# Patient Record
Sex: Male | Born: 1960 | Race: White | Hispanic: No | Marital: Married | Smoking: Never smoker
Health system: Southern US, Community
[De-identification: ages and names within clinical notes are randomized; demographics above are authoritative.]

## PROBLEM LIST (undated history)

## (undated) DIAGNOSIS — M1990 Unspecified osteoarthritis, unspecified site: Secondary | ICD-10-CM

## (undated) DIAGNOSIS — G4730 Sleep apnea, unspecified: Secondary | ICD-10-CM

## (undated) DIAGNOSIS — E039 Hypothyroidism, unspecified: Secondary | ICD-10-CM

## (undated) DIAGNOSIS — R0602 Shortness of breath: Secondary | ICD-10-CM

## (undated) DIAGNOSIS — Z8719 Personal history of other diseases of the digestive system: Secondary | ICD-10-CM

## (undated) DIAGNOSIS — Z87442 Personal history of urinary calculi: Secondary | ICD-10-CM

## (undated) HISTORY — PX: CYSTO: SHX6284

## (undated) HISTORY — PX: FISSURECTOMY: SHX5244

## (undated) HISTORY — PX: PILONIDAL CYST EXCISION: SHX744

---

## 2014-03-05 ENCOUNTER — Other Ambulatory Visit: Payer: Self-pay | Admitting: Neurosurgery

## 2014-03-14 ENCOUNTER — Encounter (HOSPITAL_COMMUNITY): Payer: Self-pay | Admitting: Pharmacy Technician

## 2014-03-16 ENCOUNTER — Encounter (HOSPITAL_COMMUNITY): Payer: Self-pay

## 2014-03-16 ENCOUNTER — Encounter (HOSPITAL_COMMUNITY)
Admission: RE | Admit: 2014-03-16 | Discharge: 2014-03-16 | Disposition: A | Discharge status: Home / Self Care | Payer: BC Managed Care – PPO | Source: Ambulatory Visit | Attending: Neurosurgery | Admitting: Neurosurgery

## 2014-03-16 ENCOUNTER — Encounter (HOSPITAL_COMMUNITY)
Admission: RE | Admit: 2014-03-16 | Discharge: 2014-03-16 | Disposition: A | Discharge status: Home / Self Care | Payer: BC Managed Care – PPO | Source: Ambulatory Visit | Attending: Anesthesiology | Admitting: Anesthesiology

## 2014-03-16 DIAGNOSIS — Z01812 Encounter for preprocedural laboratory examination: Secondary | ICD-10-CM | POA: Insufficient documentation

## 2014-03-16 DIAGNOSIS — Z01810 Encounter for preprocedural cardiovascular examination: Secondary | ICD-10-CM | POA: Insufficient documentation

## 2014-03-16 DIAGNOSIS — Z01818 Encounter for other preprocedural examination: Secondary | ICD-10-CM | POA: Insufficient documentation

## 2014-03-16 HISTORY — DX: Personal history of urinary calculi: Z87.442

## 2014-03-16 HISTORY — DX: Shortness of breath: R06.02

## 2014-03-16 HISTORY — DX: Sleep apnea, unspecified: G47.30

## 2014-03-16 HISTORY — DX: Unspecified osteoarthritis, unspecified site: M19.90

## 2014-03-16 HISTORY — DX: Personal history of other diseases of the digestive system: Z87.19

## 2014-03-16 HISTORY — DX: Hypothyroidism, unspecified: E03.9

## 2014-03-16 LAB — CBC
HEMATOCRIT: 45 % (ref 39.0–52.0)
HEMOGLOBIN: 15.7 g/dL (ref 13.0–17.0)
MCH: 29.5 pg (ref 26.0–34.0)
MCHC: 34.9 g/dL (ref 30.0–36.0)
MCV: 84.6 fL (ref 78.0–100.0)
Platelets: 237 10*3/uL (ref 150–400)
RBC: 5.32 MIL/uL (ref 4.22–5.81)
RDW: 13.2 % (ref 11.5–15.5)
WBC: 10.4 10*3/uL (ref 4.0–10.5)

## 2014-03-16 LAB — BASIC METABOLIC PANEL
BUN: 30 mg/dL — AB (ref 6–23)
CO2: 26 meq/L (ref 19–32)
Calcium: 10.6 mg/dL — ABNORMAL HIGH (ref 8.4–10.5)
Chloride: 97 mEq/L (ref 96–112)
Creatinine, Ser: 1.2 mg/dL (ref 0.50–1.35)
GFR calc Af Amer: 77 mL/min — ABNORMAL LOW (ref >90)
GFR calc non Af Amer: 66 mL/min — ABNORMAL LOW (ref >90)
GLUCOSE: 104 mg/dL — AB (ref 70–99)
POTASSIUM: 4.6 meq/L (ref 3.7–5.3)
Sodium: 138 mEq/L (ref 137–147)

## 2014-03-16 LAB — SURGICAL PCR SCREEN
MRSA, PCR: NEGATIVE
Staphylococcus aureus: NEGATIVE

## 2014-03-16 NOTE — Pre-Procedure Instructions (Addendum)
Stuart Hunter  03/16/2014   Your procedure is scheduled on:  03/23/14  Report to [facility] short stay admitting at 530 AM.  Call this number if you have problems the morning of surgery: [phone]   Remember:   Do not eat food or drink liquids after midnight.   Take these medicines the morning of surgery with A SIP OF WATER: levothyroxine          STOP all herbel meds, nsaids (aleve,naproxen,advil,ibuprofen) 5 days prior to surgery including aspirin, vitamins   Do not wear jewelry, make-up or nail polish.  Do not wear lotions, powders, or perfumes. You may wear deodorant.  Do not shave 48 hours prior to surgery. Men may shave face and neck.  Do not bring valuables to the hospital.  [facility] is not responsible                  for any belongings or valuables.               Contacts, dentures or bridgework may not be worn into surgery.  Leave suitcase in the car. After surgery it may be brought to your room.  For patients admitted to the hospital, discharge time is determined by your                treatment team.               Patients discharged the day of surgery will not be allowed to drive  home.  Name and phone number of your driver:   Special Instructions:  Special Instructions: [place] - Preparing for Surgery  Before surgery, you can play an important role.  Because skin is not sterile, your skin needs to be as free of germs as possible.  You can reduce the number of germs on you skin by washing with CHG (chlorahexidine gluconate) soap before surgery.  CHG is an antiseptic cleaner which kills germs and bonds with the skin to continue killing germs even after washing.  Please DO NOT use if you have an allergy to CHG or antibacterial soaps.  If your skin becomes reddened/irritated stop using the CHG and inform your nurse when you arrive at Short Stay.  Do not shave (including legs and underarms) for at least 48 hours prior to the first CHG shower.  You may shave your  face.  Please follow these instructions carefully:   1.  Shower with CHG Soap the night before surgery and the morning of Surgery.  2.  If you choose to wash your hair, wash your hair first as usual with your normal shampoo.  3.  After you shampoo, rinse your hair and body thoroughly to remove the Shampoo.  4.  Use CHG as you would any other liquid soap.  You can apply chg directly  to the skin and wash gently with scrungie or a clean washcloth.  5.  Apply the CHG Soap to your body ONLY FROM THE NECK DOWN.  Do not use on open wounds or open sores.  Avoid contact with your eyes ears, mouth and genitals (private parts).  Wash genitals (private parts)       with your normal soap.  6.  Wash thoroughly, paying special attention to the area where your surgery will be performed.  7.  Thoroughly rinse your body with warm water from the neck down.  8.  DO NOT shower/wash with your normal soap after using and rinsing off the CHG Soap.  9.  Pat yourself dry with a clean towel.            10.  Wear clean pajamas.            11.  Place clean sheets on your bed the night of your first shower and do not sleep with pets.  Day of Surgery  Do not apply any lotions/deodorants the morning of surgery.  Please wear clean clothes to the hospital/surgery center.   Please read over the following fact sheets that you were given: Pain Booklet, Coughing and Deep Breathing, MRSA Information and Surgical Site Infection Prevention

## 2014-03-16 NOTE — Progress Notes (Signed)
req'd sleep study from [place]

## 2014-03-23 ENCOUNTER — Ambulatory Visit (HOSPITAL_COMMUNITY): Payer: BC Managed Care – PPO

## 2014-03-23 ENCOUNTER — Encounter (HOSPITAL_COMMUNITY)
Admission: RE | Disposition: A | Discharge status: Home / Self Care | Payer: Self-pay | Source: Ambulatory Visit | Attending: Neurosurgery

## 2014-03-23 ENCOUNTER — Encounter (HOSPITAL_COMMUNITY): Payer: Self-pay | Admitting: *Deleted

## 2014-03-23 ENCOUNTER — Ambulatory Visit (HOSPITAL_COMMUNITY)
Admission: RE | Admit: 2014-03-23 | Discharge: 2014-03-24 | Disposition: A | Discharge status: Home / Self Care | Payer: BC Managed Care – PPO | Source: Ambulatory Visit | Attending: Neurosurgery | Admitting: Neurosurgery

## 2014-03-23 ENCOUNTER — Ambulatory Visit (HOSPITAL_COMMUNITY): Payer: BC Managed Care – PPO | Admitting: Anesthesiology

## 2014-03-23 ENCOUNTER — Encounter (HOSPITAL_COMMUNITY): Payer: BC Managed Care – PPO | Admitting: Anesthesiology

## 2014-03-23 DIAGNOSIS — I10 Essential (primary) hypertension: Secondary | ICD-10-CM | POA: Insufficient documentation

## 2014-03-23 DIAGNOSIS — M129 Arthropathy, unspecified: Secondary | ICD-10-CM | POA: Insufficient documentation

## 2014-03-23 DIAGNOSIS — G4730 Sleep apnea, unspecified: Secondary | ICD-10-CM | POA: Insufficient documentation

## 2014-03-23 DIAGNOSIS — M5126 Other intervertebral disc displacement, lumbar region: Secondary | ICD-10-CM | POA: Diagnosis present

## 2014-03-23 DIAGNOSIS — E039 Hypothyroidism, unspecified: Secondary | ICD-10-CM | POA: Insufficient documentation

## 2014-03-23 DIAGNOSIS — K449 Diaphragmatic hernia without obstruction or gangrene: Secondary | ICD-10-CM | POA: Insufficient documentation

## 2014-03-23 HISTORY — PX: LUMBAR LAMINECTOMY/DECOMPRESSION MICRODISCECTOMY: SHX5026

## 2014-03-23 SURGERY — LUMBAR LAMINECTOMY/DECOMPRESSION MICRODISCECTOMY 1 LEVEL
Anesthesia: General | Site: Back | Laterality: Left

## 2014-03-23 MED ORDER — SUCCINYLCHOLINE CHLORIDE 20 MG/ML IJ SOLN
INTRAMUSCULAR | Status: DC | PRN
  Administered 2014-03-23: 160 mg via INTRAVENOUS

## 2014-03-23 MED ORDER — HYDROMORPHONE HCL PF 1 MG/ML IJ SOLN: 0.5000 mg | INTRAMUSCULAR | Status: DC | PRN

## 2014-03-23 MED ORDER — GLYCOPYRROLATE 0.2 MG/ML IJ SOLN
INTRAMUSCULAR | Status: AC
  Filled 2014-03-23: qty 3

## 2014-03-23 MED ORDER — 0.9 % SODIUM CHLORIDE (POUR BTL) OPTIME
TOPICAL | Status: DC | PRN
  Administered 2014-03-23: 1000 mL

## 2014-03-23 MED ORDER — OXYCODONE HCL 5 MG/5ML PO SOLN: 5.0000 mg | Freq: Once | ORAL | Status: DC | PRN

## 2014-03-23 MED ORDER — BUPIVACAINE HCL (PF) 0.25 % IJ SOLN
INTRAMUSCULAR | Status: DC | PRN
  Administered 2014-03-23: 20 mL

## 2014-03-23 MED ORDER — ARTIFICIAL TEARS OP OINT
TOPICAL_OINTMENT | OPHTHALMIC | Status: DC | PRN
  Administered 2014-03-23: 1 via OPHTHALMIC

## 2014-03-23 MED ORDER — CEFAZOLIN SODIUM 1-5 GM-% IV SOLN
1.0000 g | Freq: Three times a day (TID) | INTRAVENOUS | Status: AC
  Administered 2014-03-23 (×2): 1 g via INTRAVENOUS
  Filled 2014-03-23 (×2): qty 50

## 2014-03-23 MED ORDER — ONDANSETRON HCL 4 MG/2ML IJ SOLN: 4.0000 mg | INTRAMUSCULAR | Status: DC | PRN

## 2014-03-23 MED ORDER — FENTANYL CITRATE 0.05 MG/ML IJ SOLN
INTRAMUSCULAR | Status: AC
  Filled 2014-03-23: qty 5

## 2014-03-23 MED ORDER — LIDOCAINE HCL (CARDIAC) 20 MG/ML IV SOLN
INTRAVENOUS | Status: AC
  Filled 2014-03-23: qty 5

## 2014-03-23 MED ORDER — LEVOTHYROXINE SODIUM 75 MCG PO TABS
75.0000 ug | ORAL_TABLET | Freq: Every day | ORAL | Status: DC
  Filled 2014-03-23 (×2): qty 1

## 2014-03-23 MED ORDER — MIDAZOLAM HCL 5 MG/5ML IJ SOLN
INTRAMUSCULAR | Status: DC | PRN
  Administered 2014-03-23: 2 mg via INTRAVENOUS

## 2014-03-23 MED ORDER — HEMOSTATIC AGENTS (NO CHARGE) OPTIME
TOPICAL | Status: DC | PRN
  Administered 2014-03-23: 1 via TOPICAL

## 2014-03-23 MED ORDER — SODIUM CHLORIDE 0.9 % IV SOLN: 250.0000 mL | INTRAVENOUS | Status: DC

## 2014-03-23 MED ORDER — MENTHOL 3 MG MT LOZG
1.0000 | LOZENGE | OROMUCOSAL | Status: DC | PRN
  Administered 2014-03-23: 3 mg via ORAL
  Filled 2014-03-23 (×2): qty 9

## 2014-03-23 MED ORDER — NEOSTIGMINE METHYLSULFATE 1 MG/ML IJ SOLN
INTRAMUSCULAR | Status: AC
  Filled 2014-03-23: qty 10

## 2014-03-23 MED ORDER — GLYCOPYRROLATE 0.2 MG/ML IJ SOLN
INTRAMUSCULAR | Status: DC | PRN
  Administered 2014-03-23: 0.6 mg via INTRAVENOUS

## 2014-03-23 MED ORDER — ACETAMINOPHEN 650 MG RE SUPP: 650.0000 mg | RECTAL | Status: DC | PRN

## 2014-03-23 MED ORDER — OXYCODONE HCL 5 MG PO TABS: 5.0000 mg | ORAL_TABLET | Freq: Once | ORAL | Status: DC | PRN

## 2014-03-23 MED ORDER — PROPOFOL 10 MG/ML IV BOLUS
INTRAVENOUS | Status: AC
  Filled 2014-03-23: qty 20

## 2014-03-23 MED ORDER — HYDROCHLOROTHIAZIDE 25 MG PO TABS
25.0000 mg | ORAL_TABLET | Freq: Every day | ORAL | Status: DC
  Administered 2014-03-23: 25 mg via ORAL
  Filled 2014-03-23 (×2): qty 1

## 2014-03-23 MED ORDER — ACETAMINOPHEN 325 MG PO TABS: 650.0000 mg | ORAL_TABLET | ORAL | Status: DC | PRN

## 2014-03-23 MED ORDER — PROPOFOL 10 MG/ML IV BOLUS
INTRAVENOUS | Status: DC | PRN
  Administered 2014-03-23: 200 mg via INTRAVENOUS

## 2014-03-23 MED ORDER — SUCCINYLCHOLINE CHLORIDE 20 MG/ML IJ SOLN
INTRAMUSCULAR | Status: AC
  Filled 2014-03-23: qty 1

## 2014-03-23 MED ORDER — NEOSTIGMINE METHYLSULFATE 1 MG/ML IJ SOLN
INTRAMUSCULAR | Status: DC | PRN
  Administered 2014-03-23: 4 mg via INTRAVENOUS

## 2014-03-23 MED ORDER — DOCUSATE SODIUM 100 MG PO CAPS
100.0000 mg | ORAL_CAPSULE | Freq: Two times a day (BID) | ORAL | Status: DC
  Administered 2014-03-23 (×2): 100 mg via ORAL
  Filled 2014-03-23 (×3): qty 1

## 2014-03-23 MED ORDER — LISINOPRIL 20 MG PO TABS
20.0000 mg | ORAL_TABLET | Freq: Every day | ORAL | Status: DC
  Administered 2014-03-23: 20 mg via ORAL
  Filled 2014-03-23 (×2): qty 1

## 2014-03-23 MED ORDER — SODIUM CHLORIDE 0.9 % IJ SOLN: 3.0000 mL | INTRAMUSCULAR | Status: DC | PRN

## 2014-03-23 MED ORDER — SODIUM CHLORIDE 0.9 % IR SOLN
Status: DC | PRN
  Administered 2014-03-23: 07:00:00

## 2014-03-23 MED ORDER — ALUM & MAG HYDROXIDE-SIMETH 200-200-20 MG/5ML PO SUSP: 30.0000 mL | Freq: Four times a day (QID) | ORAL | Status: DC | PRN

## 2014-03-23 MED ORDER — ROCURONIUM BROMIDE 50 MG/5ML IV SOLN
INTRAVENOUS | Status: AC
  Filled 2014-03-23: qty 2

## 2014-03-23 MED ORDER — LISINOPRIL-HYDROCHLOROTHIAZIDE 20-25 MG PO TABS: 1.0000 | ORAL_TABLET | Freq: Every day | ORAL | Status: DC

## 2014-03-23 MED ORDER — NEOSTIGMINE METHYLSULFATE 1 MG/ML IJ SOLN
INTRAMUSCULAR | Status: AC
Start: 2014-03-23 — End: 2014-03-23
  Filled 2014-03-23: qty 20

## 2014-03-23 MED ORDER — HYDROMORPHONE HCL PF 1 MG/ML IJ SOLN: 0.2500 mg | INTRAMUSCULAR | Status: DC | PRN

## 2014-03-23 MED ORDER — LACTATED RINGERS IV SOLN
INTRAVENOUS | Status: DC | PRN
  Administered 2014-03-23 (×2): via INTRAVENOUS

## 2014-03-23 MED ORDER — IBUPROFEN 800 MG PO TABS
800.0000 mg | ORAL_TABLET | Freq: Four times a day (QID) | ORAL | Status: DC | PRN
  Filled 2014-03-23: qty 1

## 2014-03-23 MED ORDER — CEFAZOLIN SODIUM-DEXTROSE 2-3 GM-% IV SOLR
INTRAVENOUS | Status: AC
  Administered 2014-03-23: 3 g via INTRAVENOUS
  Filled 2014-03-23: qty 50

## 2014-03-23 MED ORDER — CEFAZOLIN SODIUM 1-5 GM-% IV SOLN
INTRAVENOUS | Status: AC
  Filled 2014-03-23: qty 50

## 2014-03-23 MED ORDER — LIDOCAINE HCL (CARDIAC) 20 MG/ML IV SOLN
INTRAVENOUS | Status: DC | PRN
  Administered 2014-03-23: 80 mg via INTRAVENOUS

## 2014-03-23 MED ORDER — OXYCODONE-ACETAMINOPHEN 5-325 MG PO TABS
1.0000 | ORAL_TABLET | ORAL | Status: DC | PRN
  Administered 2014-03-23: 1 via ORAL
  Administered 2014-03-23 – 2014-03-24 (×2): 2 via ORAL
  Filled 2014-03-23: qty 2
  Filled 2014-03-23: qty 1
  Filled 2014-03-23: qty 2

## 2014-03-23 MED ORDER — ONDANSETRON HCL 4 MG/2ML IJ SOLN
INTRAMUSCULAR | Status: AC
  Filled 2014-03-23: qty 2

## 2014-03-23 MED ORDER — PHENOL 1.4 % MT LIQD: 1.0000 | OROMUCOSAL | Status: DC | PRN

## 2014-03-23 MED ORDER — ONDANSETRON HCL 4 MG/2ML IJ SOLN
INTRAMUSCULAR | Status: DC | PRN
  Administered 2014-03-23: 4 mg via INTRAVENOUS

## 2014-03-23 MED ORDER — LIDOCAINE-EPINEPHRINE 1 %-1:100000 IJ SOLN
INTRAMUSCULAR | Status: DC | PRN
  Administered 2014-03-23: 10 mL

## 2014-03-23 MED ORDER — FENTANYL CITRATE 0.05 MG/ML IJ SOLN
INTRAMUSCULAR | Status: DC | PRN
  Administered 2014-03-23 (×2): 100 ug via INTRAVENOUS

## 2014-03-23 MED ORDER — ONDANSETRON HCL 4 MG/2ML IJ SOLN: 4.0000 mg | Freq: Four times a day (QID) | INTRAMUSCULAR | Status: DC | PRN

## 2014-03-23 MED ORDER — SODIUM CHLORIDE 0.9 % IJ SOLN
3.0000 mL | Freq: Two times a day (BID) | INTRAMUSCULAR | Status: DC
  Administered 2014-03-23: 3 mL via INTRAVENOUS

## 2014-03-23 MED ORDER — ROCURONIUM BROMIDE 100 MG/10ML IV SOLN
INTRAVENOUS | Status: DC | PRN
  Administered 2014-03-23: 20 mg via INTRAVENOUS
  Administered 2014-03-23: 15 mg via INTRAVENOUS
  Administered 2014-03-23: 50 mg via INTRAVENOUS

## 2014-03-23 MED ORDER — DEXAMETHASONE SODIUM PHOSPHATE 10 MG/ML IJ SOLN
INTRAMUSCULAR | Status: DC | PRN
  Administered 2014-03-23: 8 mg via INTRAVENOUS

## 2014-03-23 MED ORDER — CYCLOBENZAPRINE HCL 10 MG PO TABS
10.0000 mg | ORAL_TABLET | Freq: Three times a day (TID) | ORAL | Status: DC | PRN
  Administered 2014-03-23 (×2): 10 mg via ORAL
  Filled 2014-03-23 (×2): qty 1

## 2014-03-23 MED ORDER — MIDAZOLAM HCL 2 MG/2ML IJ SOLN
INTRAMUSCULAR | Status: AC
  Filled 2014-03-23: qty 2

## 2014-03-23 SURGICAL SUPPLY — 63 items
ADH SKN CLS APL DERMABOND .7 (GAUZE/BANDAGES/DRESSINGS) ×1
APL SKNCLS STERI-STRIP NONHPOA (GAUZE/BANDAGES/DRESSINGS) ×2
BAG DECANTER FOR FLEXI CONT (MISCELLANEOUS) ×3 IMPLANT
BENZOIN TINCTURE PRP APPL 2/3 (GAUZE/BANDAGES/DRESSINGS) ×5 IMPLANT
BLADE SURG 11 STRL SS (BLADE) ×3 IMPLANT
BLADE SURG ROTATE 9660 (MISCELLANEOUS) IMPLANT
BRUSH SCRUB EZ PLAIN DRY (MISCELLANEOUS) ×3 IMPLANT
BUR MATCHSTICK NEURO 3.0 LAGG (BURR) ×3 IMPLANT
BUR PRECISION FLUTE 6.0 (BURR) ×3 IMPLANT
CANISTER SUCT 3000ML (MISCELLANEOUS) ×3 IMPLANT
CLOSURE WOUND 1/2 X4 (GAUZE/BANDAGES/DRESSINGS) ×1
CONT SPEC 4OZ CLIKSEAL STRL BL (MISCELLANEOUS) ×3 IMPLANT
DECANTER SPIKE VIAL GLASS SM (MISCELLANEOUS) ×3 IMPLANT
DERMABOND ADVANCED (GAUZE/BANDAGES/DRESSINGS) ×2
DERMABOND ADVANCED .7 DNX12 (GAUZE/BANDAGES/DRESSINGS) ×1 IMPLANT
DRAPE LAPAROTOMY 100X72X124 (DRAPES) ×3 IMPLANT
DRAPE MICROSCOPE LEICA (MISCELLANEOUS) ×4 IMPLANT
DRAPE MICROSCOPE ZEISS OPMI (DRAPES) ×1 IMPLANT
DRAPE POUCH INSTRU U-SHP 10X18 (DRAPES) ×5 IMPLANT
DRAPE PROXIMA HALF (DRAPES) IMPLANT
DRAPE SURG 17X23 STRL (DRAPES) ×3 IMPLANT
DRSG OPSITE 4X5.5 SM (GAUZE/BANDAGES/DRESSINGS) ×3 IMPLANT
DRSG OPSITE POSTOP 4X6 (GAUZE/BANDAGES/DRESSINGS) ×2 IMPLANT
DURAPREP 26ML APPLICATOR (WOUND CARE) ×3 IMPLANT
ELECT BLADE 4.0 EZ CLEAN MEGAD (MISCELLANEOUS) ×3
ELECT REM PT RETURN 9FT ADLT (ELECTROSURGICAL) ×3
ELECTRODE BLDE 4.0 EZ CLN MEGD (MISCELLANEOUS) IMPLANT
ELECTRODE REM PT RTRN 9FT ADLT (ELECTROSURGICAL) ×1 IMPLANT
GAUZE SPONGE 4X4 16PLY XRAY LF (GAUZE/BANDAGES/DRESSINGS) IMPLANT
GLOVE BIO SURGEON STRL SZ8 (GLOVE) ×3 IMPLANT
GLOVE ECLIPSE 7.5 STRL STRAW (GLOVE) ×8 IMPLANT
GLOVE ECLIPSE 8.5 STRL (GLOVE) ×2 IMPLANT
GLOVE EXAM NITRILE LRG STRL (GLOVE) ×2 IMPLANT
GLOVE EXAM NITRILE MD LF STRL (GLOVE) IMPLANT
GLOVE EXAM NITRILE XL STR (GLOVE) IMPLANT
GLOVE EXAM NITRILE XS STR PU (GLOVE) IMPLANT
GLOVE INDICATOR 8.0 STRL GRN (GLOVE) ×2 IMPLANT
GLOVE INDICATOR 8.5 STRL (GLOVE) ×3 IMPLANT
GOWN BRE IMP SLV AUR LG STRL (GOWN DISPOSABLE) ×3 IMPLANT
GOWN BRE IMP SLV AUR XL STRL (GOWN DISPOSABLE) ×2 IMPLANT
GOWN SPEC L3 XXLG W/TWL (GOWN DISPOSABLE) ×2 IMPLANT
GOWN STRL REIN 2XL LVL4 (GOWN DISPOSABLE) IMPLANT
GOWN STRL REUS W/ TWL XL LVL3 (GOWN DISPOSABLE) IMPLANT
GOWN STRL REUS W/TWL XL LVL3 (GOWN DISPOSABLE) ×6
KIT BASIN OR (CUSTOM PROCEDURE TRAY) ×3 IMPLANT
KIT ROOM TURNOVER OR (KITS) ×3 IMPLANT
NDL SPNL 22GX3.5 QUINCKE BK (NEEDLE) ×1 IMPLANT
NEEDLE HYPO 22GX1.5 SAFETY (NEEDLE) ×3 IMPLANT
NEEDLE SPNL 22GX3.5 QUINCKE BK (NEEDLE) ×3 IMPLANT
NS IRRIG 1000ML POUR BTL (IV SOLUTION) ×3 IMPLANT
PACK LAMINECTOMY NEURO (CUSTOM PROCEDURE TRAY) ×3 IMPLANT
RUBBERBAND STERILE (MISCELLANEOUS) ×6 IMPLANT
SPONGE GAUZE 4X4 12PLY (GAUZE/BANDAGES/DRESSINGS) ×3 IMPLANT
SPONGE SURGIFOAM ABS GEL SZ50 (HEMOSTASIS) ×3 IMPLANT
STRIP CLOSURE SKIN 1/2X4 (GAUZE/BANDAGES/DRESSINGS) ×2 IMPLANT
SUT VIC AB 0 CT1 18XCR BRD8 (SUTURE) ×1 IMPLANT
SUT VIC AB 0 CT1 8-18 (SUTURE) ×3
SUT VIC AB 2-0 CT1 18 (SUTURE) ×3 IMPLANT
SUT VICRYL 4-0 PS2 18IN ABS (SUTURE) ×3 IMPLANT
SYR 20ML ECCENTRIC (SYRINGE) ×3 IMPLANT
TOWEL OR 17X24 6PK STRL BLUE (TOWEL DISPOSABLE) ×3 IMPLANT
TOWEL OR 17X26 10 PK STRL BLUE (TOWEL DISPOSABLE) ×3 IMPLANT
WATER STERILE IRR 1000ML POUR (IV SOLUTION) ×3 IMPLANT

## 2014-03-23 NOTE — H&P (Signed)
Stuart Hunter is an 53 y.o. male.   Chief Complaint: Back and bilateral leg pain left greater right HPI: Patient is a very pleasant 53 year old gentleman who's had progressive worsening back and bilateral leg pain worse on the left ring down the back and outside of his calf into his foot his big toe consistent with an L5 nerve root pattern. Workup showed a very large disc herniation L4-5 on the left with extension centrally and slightly rightward causing severe thecal sac compression and bilateral L5 radiculopathies. Patient failed all forms of conservative treatment have progressive worsening pain and due to the size of the disc herniation and severity of the stenosis I recommended laminectomy discectomy. I recommended a left-sided approach with the possibility of having exposed the right side depending on access extensively reviewed the risks and benefits of the operation with him as well as perioperative course and expectations of outcome and alternatives of surgery he understands and agrees to proceed forward.  Past Medical History  Diagnosis Date  . Sleep apnea     dx used cpap awhile "sleep better without"  . Shortness of breath     exersion  . Hypothyroidism   . History of kidney stones   . H/O hiatal hernia   . Arthritis     Past Surgical History  Procedure Laterality Date  . Cysto    . Fissurectomy    . Pilonidal cyst excision      History reviewed. No pertinent family history. Social History:  reports that he has never smoked. He does not have any smokeless tobacco history on file. He reports that he does not drink alcohol or use illicit drugs.  Allergies: No Known Allergies  Medications Prior to Admission  Medication Sig Dispense Refill  . ibuprofen (ADVIL,MOTRIN) 200 MG tablet Take 800 mg by mouth every 6 (six) hours as needed for moderate pain.      Marland Kitchen levothyroxine (SYNTHROID, LEVOTHROID) 75 MCG tablet Take 75 mcg by mouth daily before breakfast.      .  lisinopril-hydrochlorothiazide (PRINZIDE,ZESTORETIC) 20-25 MG per tablet Take 1 tablet by mouth daily.        No results found for this or any previous visit (from the past 48 hour(s)). No results found.  Review of Systems  Constitutional: Negative.   HENT: Negative.   Eyes: Negative.   Respiratory: Negative.   Cardiovascular: Negative.   Gastrointestinal: Negative.   Genitourinary: Negative.   Musculoskeletal: Positive for back pain and joint pain.  Skin: Negative.   Neurological: Positive for tingling and sensory change.  Endo/Heme/Allergies: Negative.     Blood pressure 153/87, pulse 80, temperature 97.6 F (36.4 C), temperature source Oral, resp. rate 20, SpO2 96.00%. Physical Exam  Constitutional: He is oriented to person, place, and time. He appears well-developed and well-nourished.  HENT:  Head: Normocephalic.  Eyes: Pupils are equal, round, and reactive to light.  Neck: Normal range of motion.  Cardiovascular: Normal rate.   Respiratory: Effort normal.  GI: Soft. Bowel sounds are normal.  Neurological: He is alert and oriented to person, place, and time. He has normal strength. GCS eye subscore is 4. GCS verbal subscore is 5. GCS motor subscore is 6.  Reflex Scores:      Tricep reflexes are 2+ on the right side and 2+ on the left side.      Bicep reflexes are 2+ on the right side and 2+ on the left side.      Brachioradialis reflexes are 2+ on the  right side and 2+ on the left side.      Patellar reflexes are 2+ on the right side and 2+ on the left side.      Achilles reflexes are 2+ on the right side and 2+ on the left side. Strength is 5 out of 5 in his iliopsoas, quads, anterior tibialis, gastrocs, tibialis, and EHL.  Skin: Skin is warm and dry.     Assessment/Plan 53 year old gentleman presents for an L4-5 laminectomy microdiscectomy.  Elaina Hoops 03/23/2014, 7:12 AM

## 2014-03-23 NOTE — Progress Notes (Signed)
Brief Nutrition Note   RD drawn to patient for Positive Malnutrition Screening Tool Score   Ht: 5'11" Wt: 403 lbs BMI: 56   Diet order: Regular   53 year old gentleman who's had progressive worsening back and bilateral leg pain. Workup showed a very large disc herniation L4-5 on the left with extension centrally and slightly rightward causing severe thecal sac compression and bilateral L5 radiculopathies.  Procedure 4/17: Lumbar laminectomy discectomy L4-5 left with microdissection of the left L5 nerve root microscopic discectomy  No evidence of recent weight loss per patient weight history in chart and pt's report. Pt is eating 100% of his meals. Suspect MST score was inaccurate.   Labs and Medications Reviewed. Please consult if needed.  Carrolyn Leigh, BS Nutrition Intern  Pager: [phone]

## 2014-03-23 NOTE — Op Note (Signed)
Preoperative diagnosis: Left greater right L5 radiculopathy from large herniated nucleus pulposus L4-5 left  Postoperative diagnosis: Same  Procedure: Lumbar laminectomy discectomy L4-5 left with microdissection of the left L5 nerve root microscopic discectomy  Surgeon: Dominica Severin Sadi Arave  Assistant: Mallie Mussel pool  Anesthesia: Gen.  EBL: Minimal  History of present illness: Patient is a very pleasant 53 year old some is a progress worsening back and predominately left left leg pain rate in the back of his leg back and outside of his Tops indicative consistent with an L5 nerve root pattern workup revealed a very large disc herniation L4-5 left of center and it is predominantly left leg symptoms and imaging findings and failure conservative treatment I recommended a left-sided L4-5 laminectomy and discectomy I extensively went over the risks and benefits of the operation the patient as well as perioperative course expectations about alternatives of surgery and he understood and agreed to proceed forward.  Operative procedure: Patient brought into the or was induced under general anesthesia positioned prone on the [facility] table his back was prepped and draped in routine sterile fashion preoperative x-ray localize the appropriate level so after infiltration of 10 cc lidocaine with epi a midline incision was made and Bovie left car was used to gas in tissues and subperiosteal dissections care lamina of L4 and L5 on the left interoperative x-ray initially identify the L3-4 disc space however subsequent x-ray identified the L4-5 disc space then the lamina inferior lamina of L4 medial facet complexes suppressant of lamina of L5 was drilled down a high-speed drill. Laminotomy was begun with a 3 mm Kerrison punch identify the ligamentum flavum which was removed in piecemeal fashion. Under microscopic illumination the L5 pedicles identified the undersurface the medial gutter was further out of bed to gain access to the  lateral margins of the disc space the disc herniation free fragment was immediately identified this was teased away with I nerve hook and removed with a pituitary rongeurs then several large additional fibers removed superiorly the space and this space was identified was bulging this was incised cleanout pituitary rongeurs I probed all across the midline to the SI felt no resistance after all the fragments at the removed. Again is Kit[place] no further stenosis centrally or foraminally I felt like I got an adequate palpation the undersurface the disc space the annulus was a slack and did not feel any resistance feeling only across towards the right side did not feel like I had to expose the right side. Was in to see her get meticulous in space was maintained Gelfoam was laid up the dura the muscle fascia proximal in layers with after Vicryl and the skin was closed with a running 4 subcuticular benzo and Steri-Strips were applied patient recovered in stable condition. At the case on it counts sponge counts were correct.

## 2014-03-23 NOTE — Progress Notes (Signed)
Pt transferred to 4N07 on bari bed, pt ambulated to the bathroom without assistive device upon admission, tolerated ambulation well, slightly unsteady gait. Pt voided clear yellow urine. C/o "soreness" in the throat, minimal pain in at op site.  Will monitor.

## 2014-03-23 NOTE — Anesthesia Preprocedure Evaluation (Addendum)
Anesthesia Evaluation  Patient identified by MRN, date of birth, ID band Patient awake    Reviewed: Allergy & Precautions, H&P , NPO status , Patient's Chart, lab work & pertinent test results  History of Anesthesia Complications Negative for: history of anesthetic complications  Airway Mallampati: II  Neck ROM: full    Dental   Pulmonary shortness of breath, sleep apnea (doesnt use cpap mask) ,          Cardiovascular hypertension, Pt. on medications negative cardio ROS      Neuro/Psych negative neurological ROS     GI/Hepatic hiatal hernia,   Endo/Other  Hypothyroidism Morbid obesity  Renal/GU      Musculoskeletal  (+) Arthritis -,   Abdominal   Peds  Hematology   Anesthesia Other Findings   Reproductive/Obstetrics                          Anesthesia Physical Anesthesia Plan  ASA: II  Anesthesia Plan: General   Post-op Pain Management:    Induction: Intravenous  Airway Management Planned: Oral ETT  Additional Equipment:   Intra-op Plan:   Post-operative Plan: Extubation in OR  Informed Consent: I have reviewed the patients History and Physical, chart, labs and discussed the procedure including the risks, benefits and alternatives for the proposed anesthesia with the patient or authorized representative who has indicated his/her understanding and acceptance.     Plan Discussed with: CRNA, Anesthesiologist and Surgeon  Anesthesia Plan Comments:         Anesthesia Quick Evaluation

## 2014-03-23 NOTE — Progress Notes (Signed)
Agree with intern note. Inaccurate MST. Consult RD if needed. Pryor Ochoa RD, LDN Inpatient Clinical Dietitian Pager: [phone] After Hours Pager: [phone]

## 2014-03-23 NOTE — Transfer of Care (Signed)
Immediate Anesthesia Transfer of Care Note  Patient: Stuart Hunter  Procedure(s) Performed: Procedure(s): LEFT LUMBAR FOUR-FIVE LAMINECTOMY/DECOMPRESSION MICRODISCECTOMY  (Left)  Patient Location: PACU  Anesthesia Type:General  Level of Consciousness: awake, alert  and oriented  Airway & Oxygen Therapy: Patient Spontanous Breathing and Patient connected to nasal cannula oxygen  Post-op Assessment: Report given to PACU RN and Post -op Vital signs reviewed and stable  Post vital signs: Reviewed and stable  Complications: No apparent anesthesia complications

## 2014-03-23 NOTE — Anesthesia Postprocedure Evaluation (Signed)
Anesthesia Post Note  Patient: Stuart Hunter  Procedure(s) Performed: Procedure(s) (LRB): LEFT LUMBAR FOUR-FIVE LAMINECTOMY/DECOMPRESSION MICRODISCECTOMY  (Left)  Anesthesia type: General  Patient location: PACU  Post pain: Pain level controlled and Adequate analgesia  Post assessment: Post-op Vital signs reviewed, Patient's Cardiovascular Status Stable, Respiratory Function Stable, Patent Airway and Pain level controlled  Last Vitals:  Filed Vitals:   03/23/14 1048  BP:   Pulse: 69  Temp: 36.1 C  Resp: 13    Post vital signs: Reviewed and stable  Level of consciousness: awake, alert  and oriented  Complications: No apparent anesthesia complications

## 2014-03-24 NOTE — Discharge Instructions (Signed)
No lifting no bending no twisting no driving a riding a car less discomfort in back and forth to see me. Keep the incision clean and dry. May remove the outer dressing to 3 days cover the Steri-Strips with Saran wrap for showers only.

## 2014-03-24 NOTE — Discharge Summary (Signed)
  Physician Discharge Summary  Patient ID: Stuart Hunter MRN: [id number] DOB/AGE: 05-12-1961 53 y.o.  Admit date: 03/23/2014 Discharge date: 03/24/2014  Admission Diagnoses: Left L5 radiculopathy from herniated nuclear pulposus L4-5 left  Discharge Diagnoses: Same Active Problems:   HNP (herniated nucleus pulposus), lumbar   Discharged Condition: good  Hospital Course: Patient is admitted hospital underwent an L4-5 laminectomy and discectomy postop patient did very well recovered in the floor on the floor he was angling well voiding spontaneous he tolerating regular diet and stable for discharge was scheduled followup approximately 2 weeks.  Consults: Significant Diagnostic Studies: Treatments: Lumbar laminectomy microscope L4-5 left Discharge Exam: Blood pressure 113/68, pulse 84, temperature 98.6 F (37 C), temperature source Oral, resp. rate 18, height 5\' 11"  (1.803 m), weight 183 kg (403 lb 7.1 oz), SpO2 100.00%. Strength out of 5 wound clean and dry  Disposition: Home     Medication List         ibuprofen 200 MG tablet  Commonly known as:  ADVIL,MOTRIN  Take 800 mg by mouth every 6 (six) hours as needed for moderate pain.     levothyroxine 75 MCG tablet  Commonly known as:  SYNTHROID, LEVOTHROID  Take 75 mcg by mouth daily before breakfast.     lisinopril-hydrochlorothiazide 20-25 MG per tablet  Commonly known as:  PRINZIDE,ZESTORETIC  Take 1 tablet by mouth daily.           Follow-up Information   Follow up with [facility], MD.   Specialty:  Neurosurgery   Contact information:   [address] [place] [address] [phone]       Signed: Elaina Hoops 03/24/2014, 8:17 AM

## 2014-03-24 NOTE — Progress Notes (Signed)
Pt d/c to home by car with family. Assessment stable. Pt verbalizes understanding of d/c instructions. 

## 2014-03-24 NOTE — Progress Notes (Signed)
Patient ID: Stuart Hunter, male   DOB: 05-13-1961, 53 y.o.   MRN: [id number] Doing well no leg pain numbness improving  Strength out of 5 wound clean and dry  Discharge home

## 2014-03-27 ENCOUNTER — Encounter (HOSPITAL_COMMUNITY): Payer: Self-pay | Admitting: Neurosurgery

## 2016-01-10 ENCOUNTER — Other Ambulatory Visit (HOSPITAL_BASED_OUTPATIENT_CLINIC_OR_DEPARTMENT_OTHER): Payer: Self-pay | Admitting: Neurosurgery

## 2016-01-10 DIAGNOSIS — M5126 Other intervertebral disc displacement, lumbar region: Secondary | ICD-10-CM

## 2016-01-18 ENCOUNTER — Ambulatory Visit (HOSPITAL_BASED_OUTPATIENT_CLINIC_OR_DEPARTMENT_OTHER)
Admission: RE | Admit: 2016-01-18 | Discharge: 2016-01-18 | Disposition: A | Discharge status: Home / Self Care | Payer: BLUE CROSS/BLUE SHIELD | Source: Ambulatory Visit | Attending: Neurosurgery | Admitting: Neurosurgery

## 2016-01-18 DIAGNOSIS — M5126 Other intervertebral disc displacement, lumbar region: Secondary | ICD-10-CM | POA: Insufficient documentation

## 2016-01-18 DIAGNOSIS — Z8546 Personal history of malignant neoplasm of prostate: Secondary | ICD-10-CM | POA: Diagnosis not present

## 2016-01-18 DIAGNOSIS — G8929 Other chronic pain: Secondary | ICD-10-CM | POA: Diagnosis not present

## 2016-01-18 DIAGNOSIS — M4806 Spinal stenosis, lumbar region: Secondary | ICD-10-CM | POA: Insufficient documentation

## 2016-01-18 MED ORDER — GADOBENATE DIMEGLUMINE 529 MG/ML IV SOLN: 20.0000 mL | Freq: Once | INTRAVENOUS | Status: DC | PRN

## 2016-02-07 ENCOUNTER — Other Ambulatory Visit (HOSPITAL_BASED_OUTPATIENT_CLINIC_OR_DEPARTMENT_OTHER): Payer: Self-pay | Admitting: Neurosurgery

## 2016-02-07 DIAGNOSIS — D492 Neoplasm of unspecified behavior of bone, soft tissue, and skin: Secondary | ICD-10-CM

## 2016-02-08 ENCOUNTER — Ambulatory Visit (HOSPITAL_BASED_OUTPATIENT_CLINIC_OR_DEPARTMENT_OTHER)
Admission: RE | Admit: 2016-02-08 | Discharge: 2016-02-08 | Disposition: A | Discharge status: Home / Self Care | Payer: BLUE CROSS/BLUE SHIELD | Source: Ambulatory Visit | Attending: Neurosurgery | Admitting: Neurosurgery

## 2016-02-08 DIAGNOSIS — D492 Neoplasm of unspecified behavior of bone, soft tissue, and skin: Secondary | ICD-10-CM | POA: Diagnosis not present

## 2016-02-08 DIAGNOSIS — M47814 Spondylosis without myelopathy or radiculopathy, thoracic region: Secondary | ICD-10-CM | POA: Diagnosis not present

## 2016-02-08 MED ORDER — GADOBENATE DIMEGLUMINE 529 MG/ML IV SOLN: 20.0000 mL | Freq: Once | INTRAVENOUS | Status: DC | PRN

## 2016-02-10 ENCOUNTER — Ambulatory Visit (INDEPENDENT_AMBULATORY_CARE_PROVIDER_SITE_OTHER): Payer: BLUE CROSS/BLUE SHIELD

## 2016-02-10 ENCOUNTER — Other Ambulatory Visit (HOSPITAL_BASED_OUTPATIENT_CLINIC_OR_DEPARTMENT_OTHER): Payer: Self-pay | Admitting: Neurosurgery

## 2016-02-10 DIAGNOSIS — D492 Neoplasm of unspecified behavior of bone, soft tissue, and skin: Secondary | ICD-10-CM | POA: Diagnosis not present

## 2016-02-10 MED ORDER — GADOBENATE DIMEGLUMINE 529 MG/ML IV SOLN
20.0000 mL | Freq: Once | INTRAVENOUS | Status: AC | PRN
  Administered 2016-02-10: 20 mL via INTRAVENOUS

## 2017-09-03 IMAGING — MR MR CERVICAL SPINE WO/W CM
7 of 8 series · 37 of 48 positions shown · IV contrast (multihance)
Comparison: No comparison cervical spine MR.

CLINICAL DATA: 55-year-old male with popping and cracking sensation
when turning head. Right neck pain. Recent lumbar spine MR with
possible nerve sheath tumor. Prior history stated prostate cancer.
Subsequent encounter.

EXAM:
MRI CERVICAL SPINE WITHOUT AND WITH CONTRAST
TECHNIQUE: Multiplanar and multiecho pulse sequences of the cervical spine, to
include the craniocervical junction and cervicothoracic junction,
were obtained according to standard protocol without and with
intravenous contrast.
CONTRAST:  20mL MULTIHANCE GADOBENATE DIMEGLUMINE 529 MG/ML IV SOLN

[Series 2: T2 · sagittal · 3.0mm · 0.59mm/px · 4 of 15 slices shown (1 of 2)]
[im 1/15]
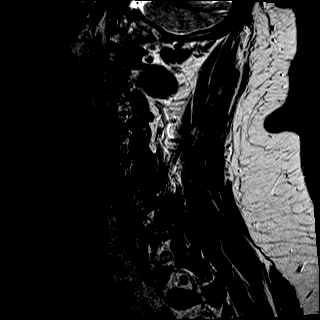
[im 5/15]
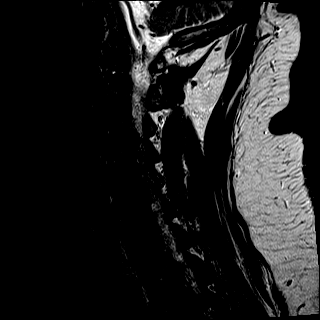
[im 10/15]
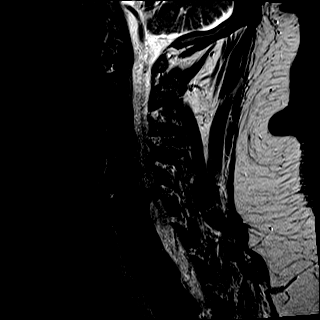
[im 15/15]
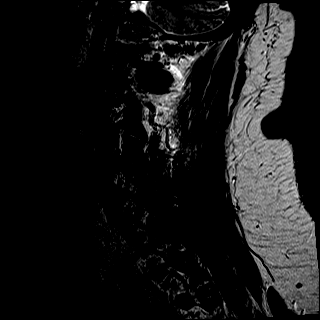

[Series 3: T1 · sagittal · 3.0mm · 0.74mm/px · 4 of 15 slices shown (1 of 2)]
[im 1/15]
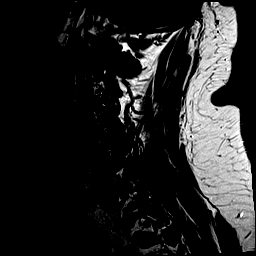
[im 5/15]
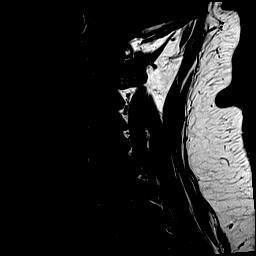
[im 10/15]
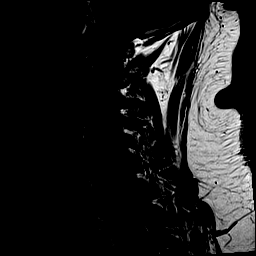
[im 15/15]
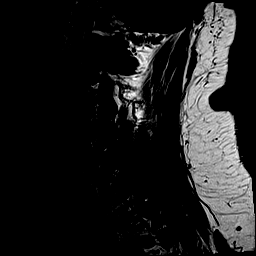

[Series 4: STIR · sagittal · 3.0mm · 0.37mm/px · 4 of 15 slices shown]
[im 1/15]
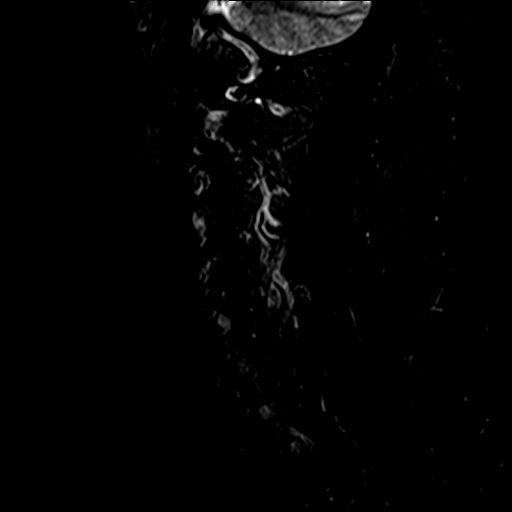
[im 5/15]
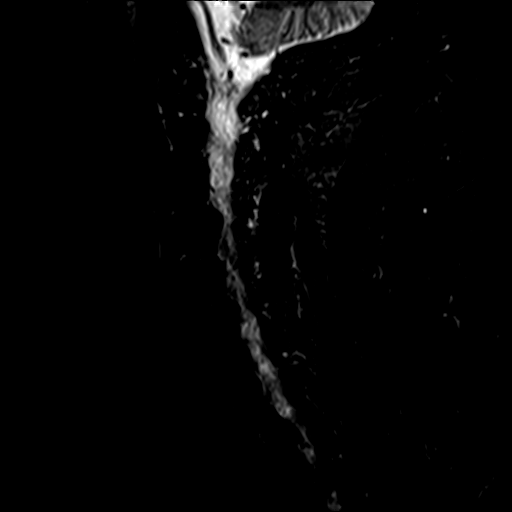
[im 10/15]
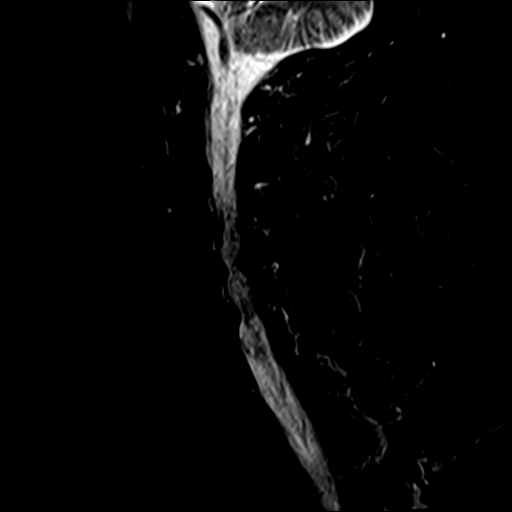
[im 15/15]
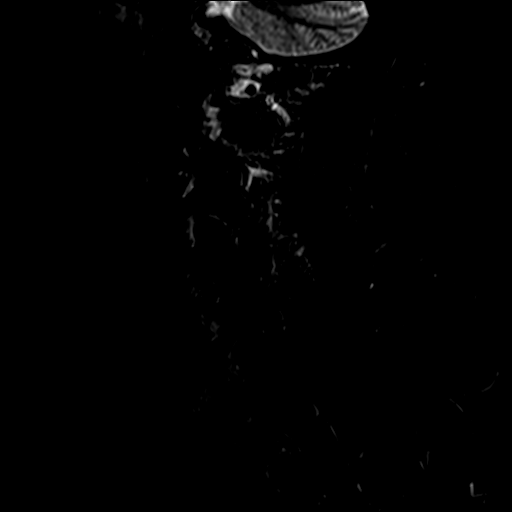

[Series 5: T2 · axial · 3.0mm · 0.62mm/px · z∈[-119,-15]mm · 8 of 29 slices shown (2 of 2)]
[im 1/29]
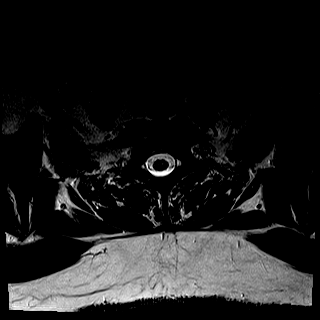
[im 5/29]
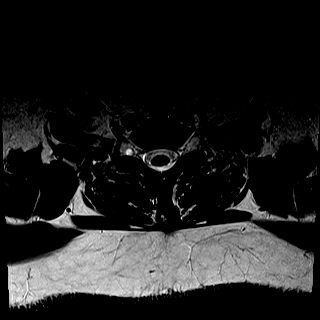
[im 9/29]
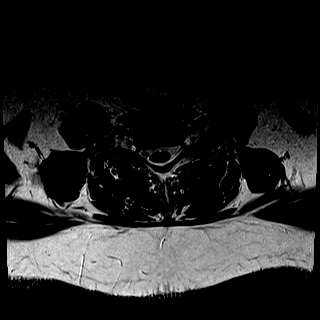
[im 13/29]
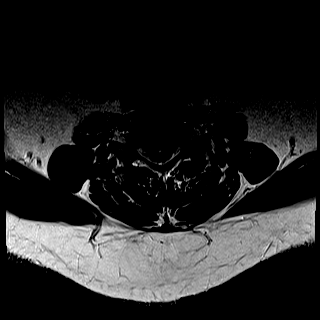
[im 17/29]
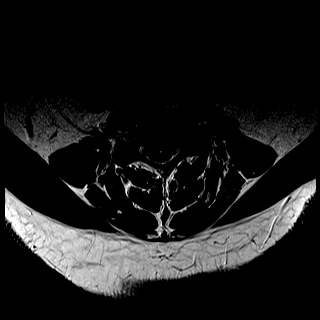
[im 21/29]
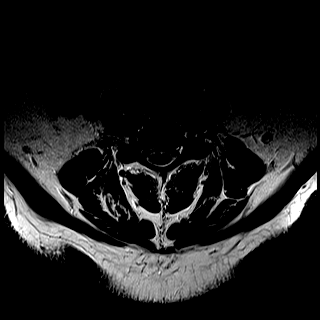
[im 25/29]
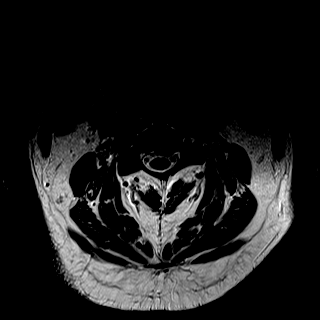
[im 29/29]
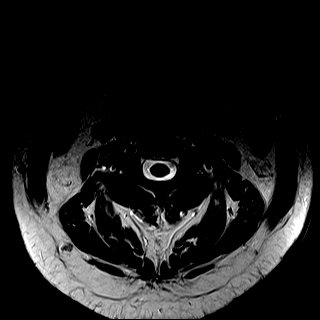

[Series 7: T1 · axial · non-contrast · 3.0mm · 0.62mm/px · z∈[-119,-15]mm · 8 of 29 slices shown (2 of 2)]
[im 1/29]
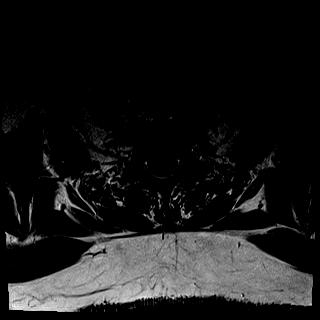
[im 5/29]
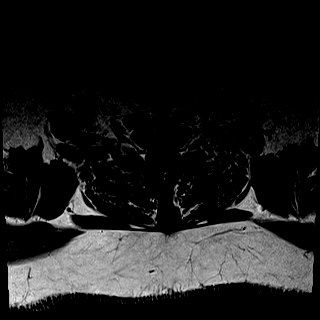
[im 9/29]
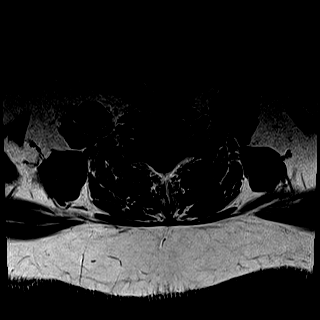
[im 13/29]
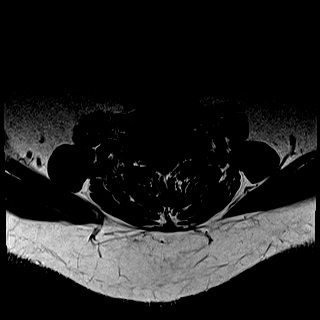
[im 17/29]
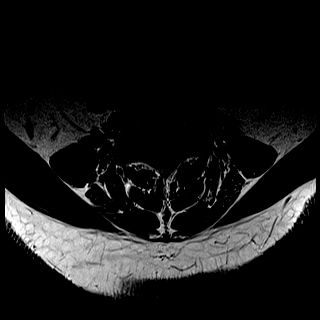
[im 21/29]
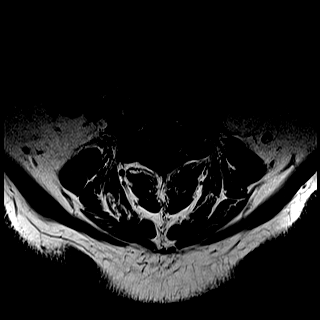
[im 25/29]
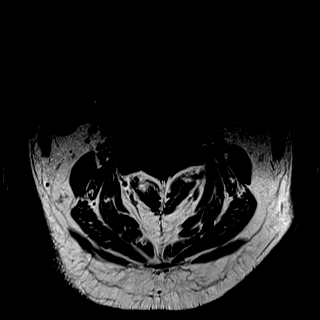
[im 29/29]
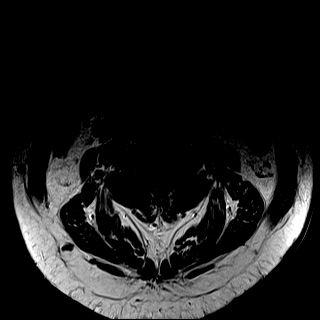

[Series 8: T1 fat-sat post-contrast · sagittal · 3.0mm · 0.59mm/px · 4 of 15 slices shown]
[im 1/15]
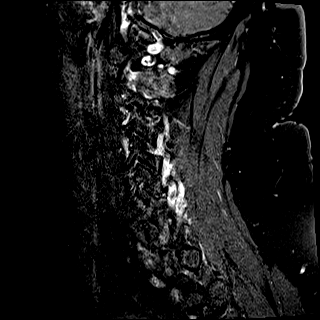
[im 5/15]
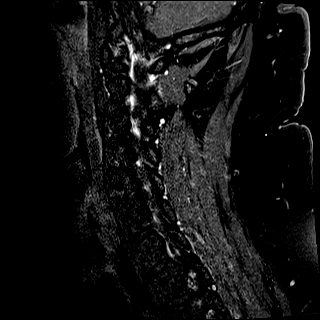
[im 10/15]
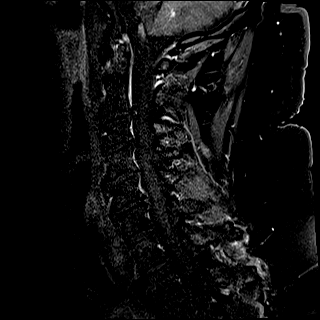
[im 15/15]
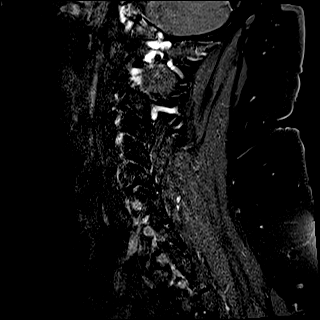

[Series 9: T1 post-contrast · axial · 3.0mm · 0.62mm/px · z∈[-119,-59]mm · 5 of 29 slices shown]
[im 1/29]
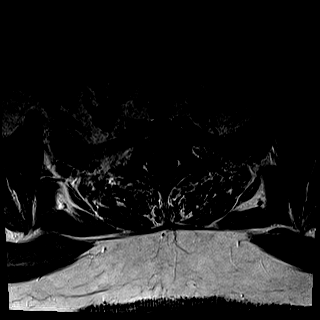
[im 5/29]
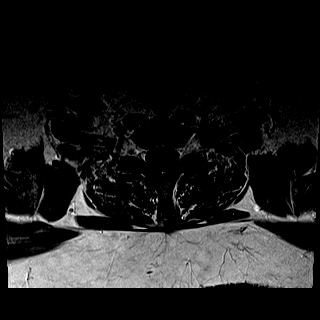
[im 9/29]
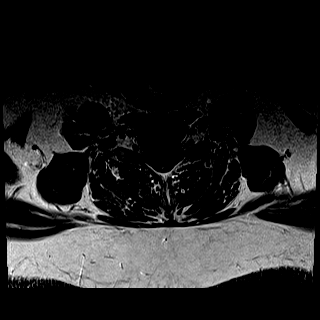
[im 13/29]
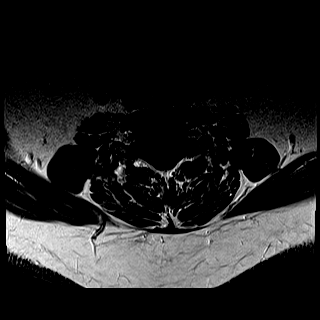
[im 17/29]
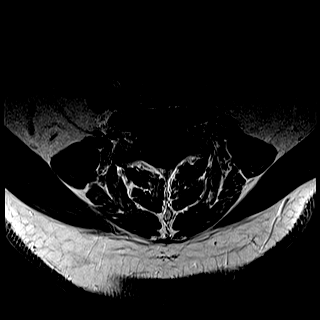

[37 of 48 positions shown; findings below may reference images not displayed]

brain MR same date
dictated separately. 02/08/2016 thoracic spine MR. 01/18/2016 lumbar
spine MR.
FINDINGS: Small developmental venous anomaly incidentally noted left lower
cerebellum. Cervical medullary junction unremarkable.

Located posterior inferior to the C4 spinous process is a
nonspecific soft tissue 1.1 x 0.7 x 0.6 cm enhancing lesion.

No abnormal enhancing lesion of cord or nerve roots. No focal
sclerotic lesion suspicious for osseous metastatic disease.

C2-3: Minimal right facet degenerative changes. Minimal Schmorl's
node deformity inferior endplate. Minimal left post lateral spur.

C3-4: Broad-based disc osteophyte complex with narrowing ventral
aspect of the thecal sac. Focal right posterior lateral/foraminal
spur/uncinate hypertrophy. Narrowing right lateral aspect of the
thecal sac and moderate to marked narrowing right neural foramen.
Very mild left foraminal narrowing.

C4-5: Broad-based disc osteophyte complex with baseline mild to
moderate spinal stenosis. Right paracentral small caudally extending
protrusion further contributes to thecal sac narrowing with slight
flattening right aspect of the cord. Uncinate hypertrophy. Mild to
moderate right-sided and moderate to marked left-sided foraminal
narrowing.

C5-6: Broad-based disc osteophyte complex minimally more notable on
the right. Moderate spinal stenosis and slight cord flattening
greater on the right. Uncinate hypertrophy. Moderate to marked
foraminal narrowing bilaterally.

C6-7: Small right foraminal protrusion spur with narrowing of the
inferior aspect the right neural foramen and slight narrowing right
lateral aspect of the thecal sac.

C7-T1:  Minimal facet degenerative change greater on left.
IMPRESSION: No abnormal enhancing lesion of cord or nerve roots. No focal
sclerotic lesion suspicious for osseous metastatic disease.

Located posterior inferior to the C4 spinous process is a
nonspecific soft tissue 1.1 x 0.7 x 0.6 cm enhancing lesion.

C3-4 broad-based disc osteophyte complex with narrowing ventral
aspect of the thecal sac. Focal right posterior lateral/foraminal
spur/uncinate hypertrophy. Narrowing right lateral aspect of the
thecal sac and moderate to marked narrowing right neural foramen.
Very mild left foraminal narrowing.

C4-5 broad-based disc osteophyte complex with baseline mild to
moderate spinal stenosis. Right paracentral small caudally extending
protrusion further contributes to thecal sac narrowing with slight
flattening right aspect of the cord. Uncinate hypertrophy. Mild to
moderate right-sided and moderate to marked left-sided foraminal
narrowing.

C5-6 broad-based disc osteophyte complex minimally more notable on
the right. Moderate spinal stenosis and slight cord flattening
greater on the right. Uncinate hypertrophy. Moderate to marked
foraminal narrowing bilaterally.

C6-7 small right foraminal protrusion spur with narrowing of the
inferior aspect the right neural foramen and slight narrowing right
lateral aspect of the thecal sac.

## 2018-07-12 ENCOUNTER — Ambulatory Visit: Payer: BLUE CROSS/BLUE SHIELD | Admitting: Cardiology
